# Patient Record
Sex: Male | Born: 1955 | Race: Black or African American | Hispanic: No | Smoking: Never smoker
Health system: Southern US, Community
[De-identification: ages and names within clinical notes are randomized; demographics above are authoritative.]

## PROBLEM LIST (undated history)

## (undated) DIAGNOSIS — D649 Anemia, unspecified: Secondary | ICD-10-CM

## (undated) DIAGNOSIS — M25562 Pain in left knee: Secondary | ICD-10-CM

## (undated) DIAGNOSIS — K709 Alcoholic liver disease, unspecified: Secondary | ICD-10-CM

## (undated) DIAGNOSIS — F1010 Alcohol abuse, uncomplicated: Secondary | ICD-10-CM

## (undated) DIAGNOSIS — R569 Unspecified convulsions: Secondary | ICD-10-CM

## (undated) DIAGNOSIS — E785 Hyperlipidemia, unspecified: Secondary | ICD-10-CM

## (undated) DIAGNOSIS — H25019 Cortical age-related cataract, unspecified eye: Secondary | ICD-10-CM

## (undated) HISTORY — DX: Pain in left knee: M25.562

## (undated) HISTORY — PX: WISDOM TOOTH EXTRACTION: SHX21

## (undated) HISTORY — DX: Alcoholic liver disease, unspecified: K70.9

## (undated) HISTORY — DX: Hyperlipidemia, unspecified: E78.5

## (undated) HISTORY — DX: Anemia, unspecified: D64.9

## (undated) HISTORY — DX: Cortical age-related cataract, unspecified eye: H25.019

## (undated) HISTORY — DX: Alcohol abuse, uncomplicated: F10.10

## (undated) HISTORY — PX: KNEE SURGERY: SHX244

---

## 1978-03-16 HISTORY — PX: FINGER SURGERY: SHX640

## 2004-06-04 ENCOUNTER — Emergency Department (HOSPITAL_COMMUNITY): Admission: EM | Admit: 2004-06-04 | Discharge: 2004-06-04 | Payer: Self-pay | Admitting: Emergency Medicine

## 2004-06-12 ENCOUNTER — Emergency Department (HOSPITAL_COMMUNITY): Admission: EM | Admit: 2004-06-12 | Discharge: 2004-06-12 | Payer: Self-pay | Admitting: *Deleted

## 2011-03-27 ENCOUNTER — Emergency Department (HOSPITAL_COMMUNITY): Payer: Self-pay

## 2011-03-27 ENCOUNTER — Encounter (HOSPITAL_COMMUNITY): Payer: Self-pay | Admitting: *Deleted

## 2011-03-27 ENCOUNTER — Emergency Department (HOSPITAL_COMMUNITY)
Admission: EM | Admit: 2011-03-27 | Discharge: 2011-03-27 | Disposition: A | Discharge status: Home / Self Care | Payer: Self-pay | Attending: Emergency Medicine | Admitting: Emergency Medicine

## 2011-03-27 DIAGNOSIS — F1010 Alcohol abuse, uncomplicated: Secondary | ICD-10-CM | POA: Insufficient documentation

## 2011-03-27 DIAGNOSIS — R569 Unspecified convulsions: Secondary | ICD-10-CM | POA: Insufficient documentation

## 2011-03-27 DIAGNOSIS — J189 Pneumonia, unspecified organism: Secondary | ICD-10-CM | POA: Insufficient documentation

## 2011-03-27 HISTORY — DX: Unspecified convulsions: R56.9

## 2011-03-27 LAB — POCT I-STAT, CHEM 8
BUN: 9 mg/dL (ref 6–23)
Calcium, Ion: 1.06 mmol/L — ABNORMAL LOW (ref 1.12–1.32)
Chloride: 102 meq/L (ref 96–112)
Creatinine, Ser: 1 mg/dL (ref 0.50–1.35)
Glucose, Bld: 72 mg/dL (ref 70–99)
HCT: 44 % (ref 39.0–52.0)
Hemoglobin: 15 g/dL (ref 13.0–17.0)
Potassium: 3.7 meq/L (ref 3.5–5.1)
Sodium: 137 meq/L (ref 135–145)
TCO2: 22 mmol/L (ref 0–100)

## 2011-03-27 LAB — DIFFERENTIAL
Basophils Absolute: 0 10*3/uL (ref 0.0–0.1)
Basophils Relative: 0 % (ref 0–1)
Lymphocytes Relative: 37 % (ref 12–46)
Monocytes Absolute: 0.6 10*3/uL (ref 0.1–1.0)
Neutro Abs: 3 10*3/uL (ref 1.7–7.7)
Neutrophils Relative %: 53 % (ref 43–77)

## 2011-03-27 LAB — RAPID URINE DRUG SCREEN, HOSP PERFORMED
Amphetamines: NOT DETECTED
Barbiturates: NOT DETECTED
Benzodiazepines: NOT DETECTED
Cocaine: POSITIVE — AB
Opiates: NOT DETECTED
Tetrahydrocannabinol: POSITIVE — AB

## 2011-03-27 LAB — URINALYSIS, ROUTINE W REFLEX MICROSCOPIC
Bilirubin Urine: NEGATIVE
Leukocytes, UA: NEGATIVE
Nitrite: NEGATIVE
Specific Gravity, Urine: 1.026 (ref 1.005–1.030)
Urobilinogen, UA: 1 mg/dL (ref 0.0–1.0)

## 2011-03-27 LAB — CBC
HCT: 38.9 % — ABNORMAL LOW (ref 39.0–52.0)
MCHC: 35.5 g/dL (ref 30.0–36.0)
Platelets: 166 10*3/uL (ref 150–400)
RDW: 12.8 % (ref 11.5–15.5)
WBC: 5.7 10*3/uL (ref 4.0–10.5)

## 2011-03-27 LAB — ETHANOL: Alcohol, Ethyl (B): 42 mg/dL — ABNORMAL HIGH (ref 0–11)

## 2011-03-27 MED ORDER — LEVOFLOXACIN 500 MG PO TABS: 500.0000 mg | ORAL_TABLET | Freq: Every day | ORAL | Status: AC

## 2011-03-27 MED ORDER — MOXIFLOXACIN HCL IN NACL 400 MG/250ML IV SOLN: 400.0000 mg | Freq: Once | INTRAVENOUS | Status: DC

## 2011-03-27 MED ORDER — SODIUM CHLORIDE 0.9 % IV SOLN
1000.0000 mg | Freq: Once | INTRAVENOUS | Status: AC
  Administered 2011-03-27: 1000 mg via INTRAVENOUS
  Filled 2011-03-27: qty 10

## 2011-03-27 MED ORDER — MOXIFLOXACIN HCL 400 MG PO TABS
400.0000 mg | ORAL_TABLET | Freq: Once | ORAL | Status: AC
  Administered 2011-03-27: 400 mg via ORAL
  Filled 2011-03-27: qty 1

## 2011-03-27 NOTE — ED Provider Notes (Signed)
History     CSN: [id number]  Arrival date & time 03/27/11  4098   First MD Initiated Contact with Patient 03/27/11 0119      Chief Complaint  Patient presents with  . Seizures    (Consider location/radiation/quality/duration/timing/severity/associated sxs/prior treatment) Patient is a 56 y.o. male presenting with seizures. The history is provided by the patient. No language interpreter was used.  Seizures  This is a recurrent problem. The current episode started 12 to 24 hours ago. The problem has not changed since onset.Number of times: unknown. Duration: unknown no one at jail witnessed. Pertinent negatives include no sleepiness, no headaches, no speech difficulty, no visual disturbance, no neck stiffness, no sore throat, no chest pain, no cough, no nausea, no vomiting, no diarrhea and no muscle weakness. unable to say The episode was not witnessed. There was no sensation of an aura present. The seizures did not continue in the ED. Possible causes include missed seizure meds and change in alcohol use. The maximum temperature recorded prior to his arrival was 100 to 100.9 F. There were no medications administered prior to arrival.    Past Medical History  Diagnosis Date  . Seizures     History reviewed. No pertinent past surgical history.  History reviewed. No pertinent family history.  History  Substance Use Topics  . Smoking status: Never Smoker   . Smokeless tobacco: Not on file  . Alcohol Use: Yes      Review of Systems  Constitutional: Negative for fever and activity change.  HENT: Negative for sore throat and facial swelling.   Eyes: Negative for visual disturbance.  Respiratory: Negative for cough.   Cardiovascular: Negative for chest pain.  Gastrointestinal: Negative for nausea, vomiting, diarrhea and abdominal distention.  Genitourinary: Negative for difficulty urinating.  Skin: Negative.   Neurological: Positive for seizures. Negative for speech difficulty  and headaches.  Hematological: Negative.   Psychiatric/Behavioral: Negative.     Allergies  Review of patient's allergies indicates no known allergies.  Home Medications   Current Outpatient Rx  Name Route Sig Dispense Refill  . PRESCRIPTION MEDICATION  Seizure medication. Patient says it starts with a "T" but he can't tell us any more information about it.      BP 145/99  Pulse 69  Temp(Src) 98.1 F (36.7 C) (Oral)  Resp 23  SpO2 100%  Physical Exam  Constitutional: He appears well-developed and well-nourished. No distress.  HENT:  Head: Normocephalic and atraumatic.  Mouth/Throat: Oropharynx is clear and moist. No oropharyngeal exudate.  Eyes: Conjunctivae and EOM are normal. Pupils are equal, round, and reactive to light.       No hemotympanum  Neck: Normal range of motion. Neck supple. No JVD present.  Cardiovascular: Normal rate and regular rhythm.   Pulmonary/Chest: Effort normal and breath sounds normal. He has no wheezes. He has no rales.  Abdominal: Soft. Bowel sounds are normal. There is no tenderness. There is no rebound and no guarding.  Musculoskeletal: Normal range of motion. He exhibits no edema.  Neurological: He is alert. He has normal reflexes. No cranial nerve deficit.  Skin: Skin is warm and dry.  Psychiatric: He has a normal mood and affect.    ED Course  Procedures (including critical care time)  Labs Reviewed  CBC - Abnormal; Notable for the following:    RBC 4.15 (*)    HCT 38.9 (*)    All other components within normal limits  ETHANOL - Abnormal; Notable for the following:  Alcohol, Ethyl (B) 42 (*)    All other components within normal limits  POCT I-STAT, CHEM 8 - Abnormal; Notable for the following:    Calcium, Ion 1.06 (*)    All other components within normal limits  DIFFERENTIAL  I-STAT, CHEM 8  URINALYSIS, ROUTINE W REFLEX MICROSCOPIC  URINE RAPID DRUG SCREEN (HOSP PERFORMED)   No results found.   No diagnosis  found.    MDM  Return for worsening symptoms take all antibiotics restart seizure medications.  DFollow up with your own doctor and neurologist        Tomika Eckles K Torrez Renfroe-Rasch, MD 03/27/11 [phone]

## 2011-03-27 NOTE — ED Notes (Signed)
No changes, (denies: needs, questions, concerns or sx unmet), denies pain at this time, given Rx x2, IV cath d/c'd cath intact, site U. Calm, alert, NAD, out in w/c with deputy & PD.
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
## 2011-03-27 NOTE — ED Notes (Signed)
Pt c/o seizures, multiple seizures in the past couple days.  Last dose of seizure medication on Wednesday.  States med starts with T, unable to say what medication.

## 2011-03-27 NOTE — ED Notes (Signed)
Back from CT/xray, no changes, alert, NAD, calm, interactive, Keppra infused, law enforcement at [facility], no complaints.

## 2013-03-13 IMAGING — CT CT HEAD W/O CM
2 series · 16 of 30 positions shown, 20 images · non-contrast
Comparison: None.

CLINICAL DATA: Seizures.

CT HEAD WITHOUT CONTRAST
TECHNIQUE: Contiguous axial images were obtained from the base of
the skull through the vertex without contrast.

[Series 2: head w/o · axial · non-contrast · 0.49mm/px · z∈[+110,+240]mm · 13 of 32 slices shown, 17 images]
[im 3/32  brain]
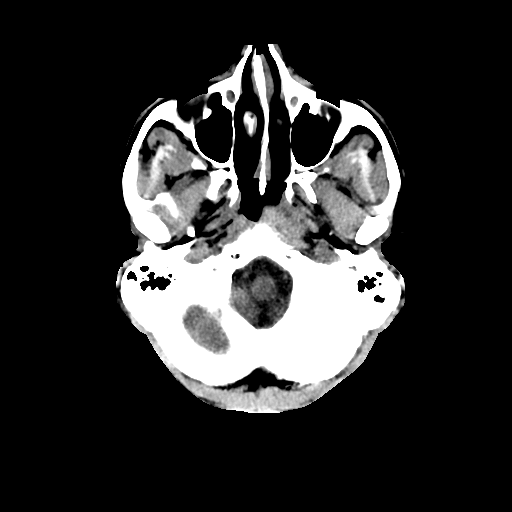
[im 3/32  bone]
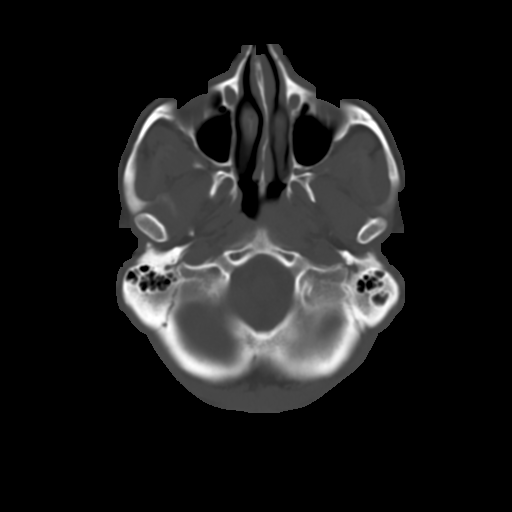
[im 5/32  brain]
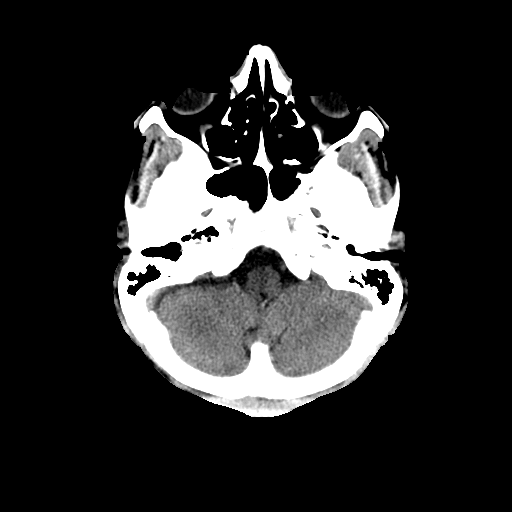
[im 7/32  brain]
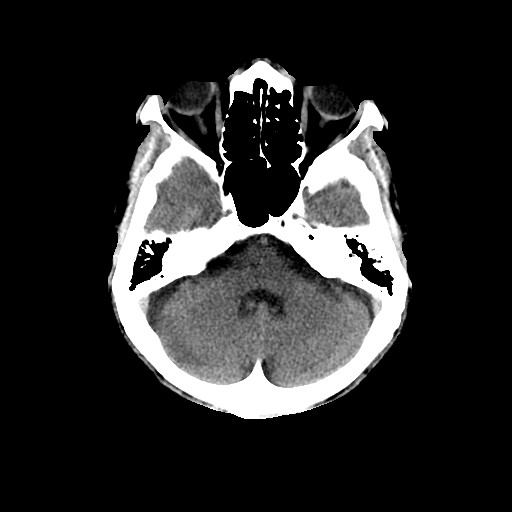
[im 9/32  brain]
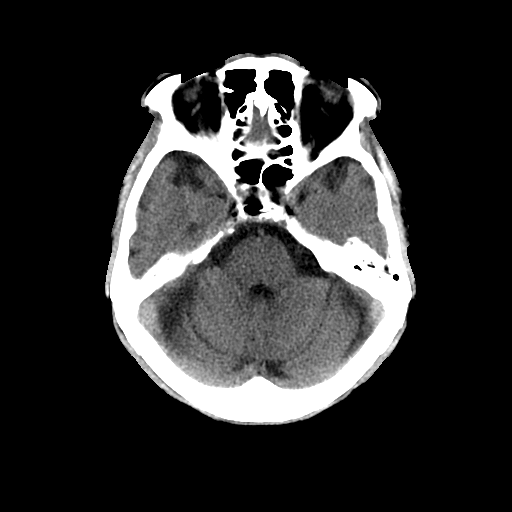
[im 12/32  brain]
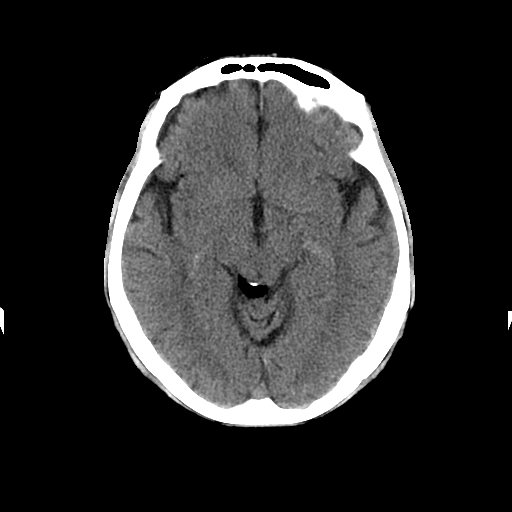
[im 12/32  bone]
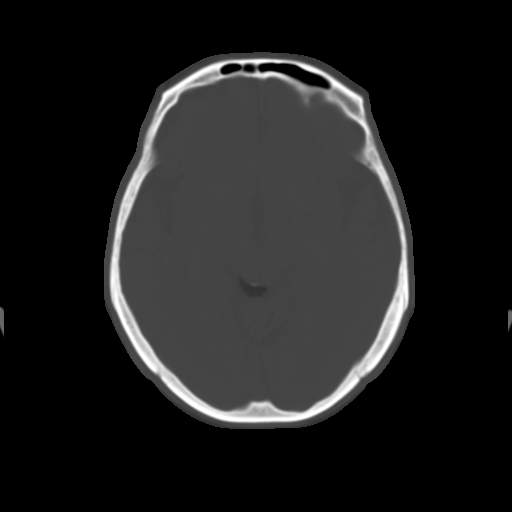
[im 14/32  brain]
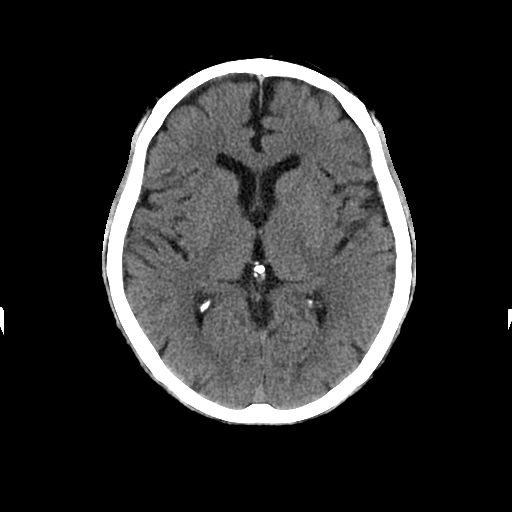
[im 16/32  brain]
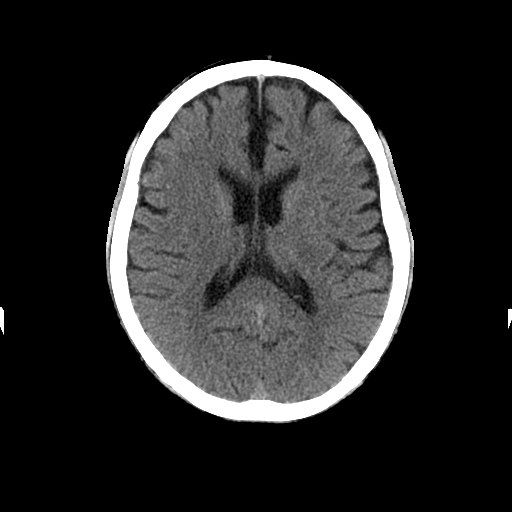
[im 18/32  brain]
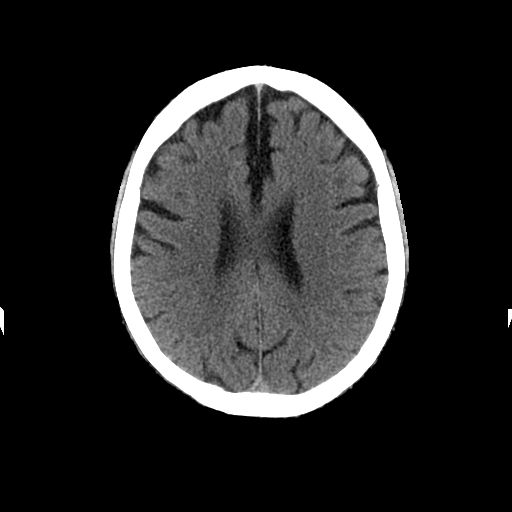
[im 20/32  brain]
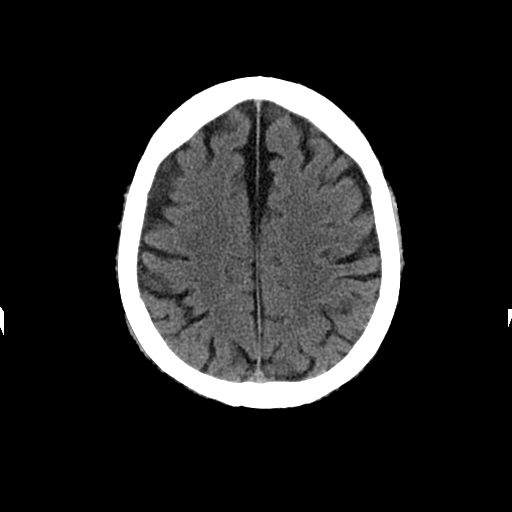
[im 20/32  bone]
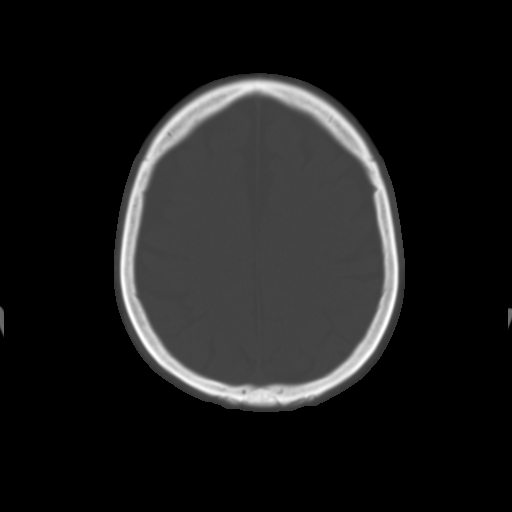
[im 23/32  brain]
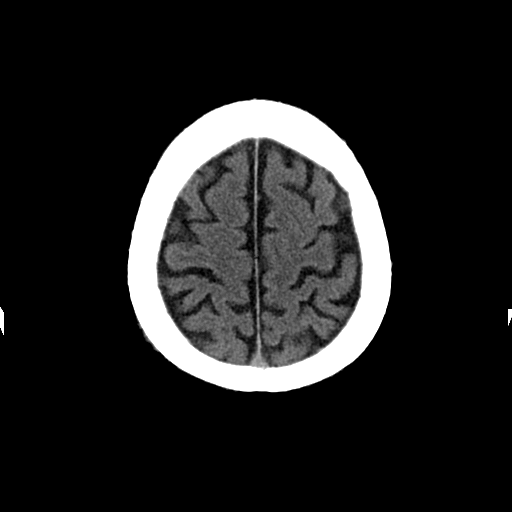
[im 25/32  brain]
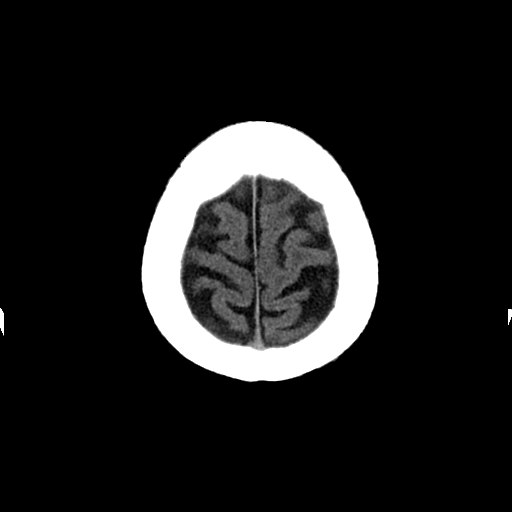
[im 27/32  brain]
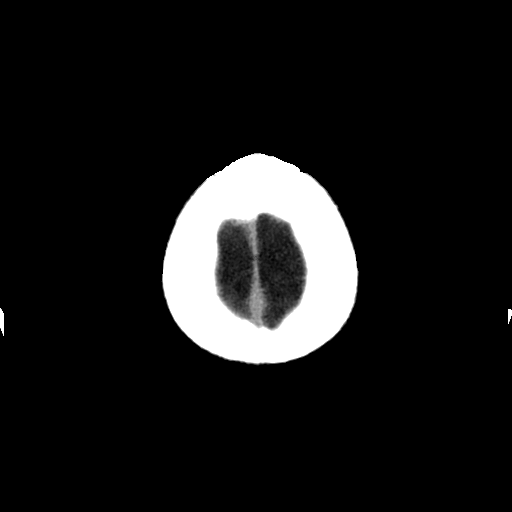
[im 29/32  brain]
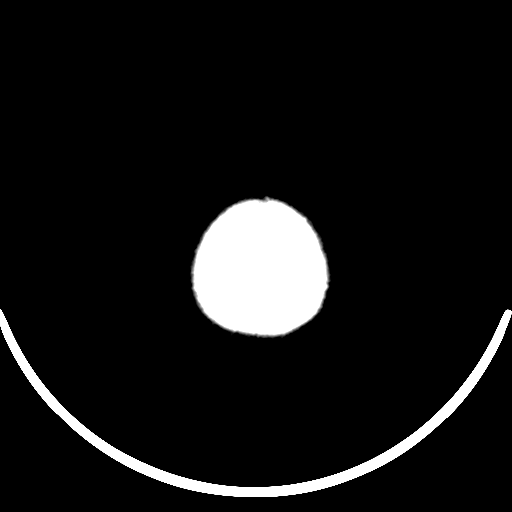
[im 29/32  bone]
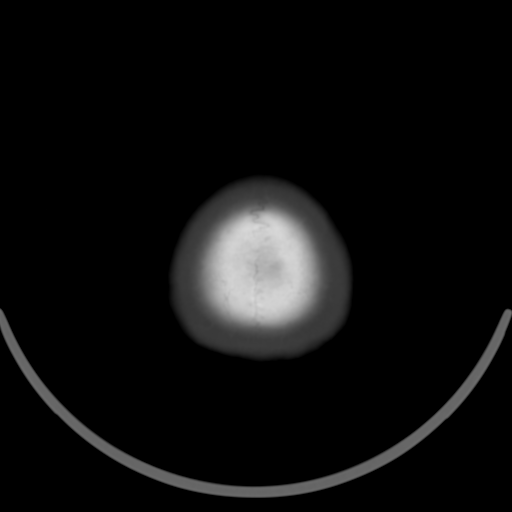

[Series 3: head w/o bone · axial · non-contrast · 0.49mm/px · z∈[+110,+155]mm · 3 of 32 slices shown]
[im 3/32  bone]
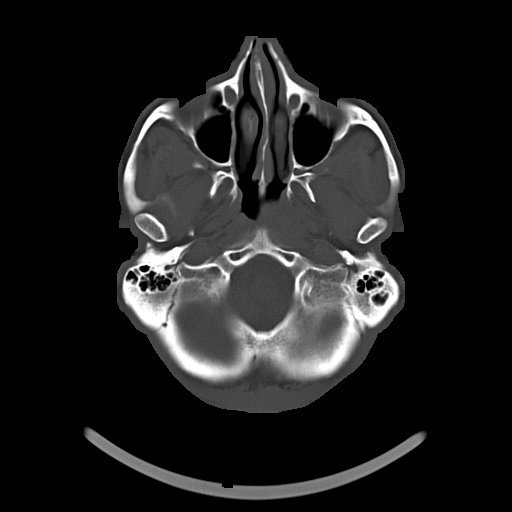
[im 7/32  bone]
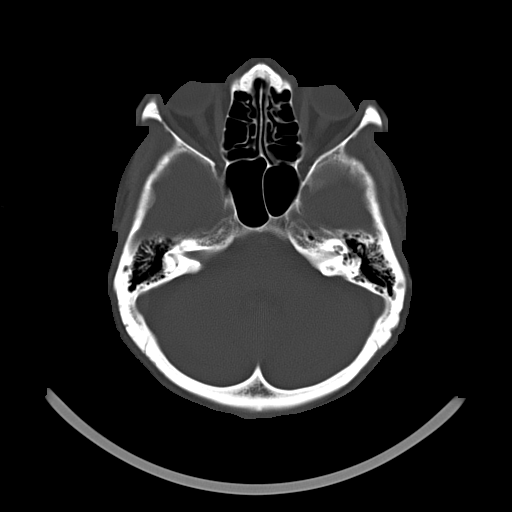
[im 12/32  bone]
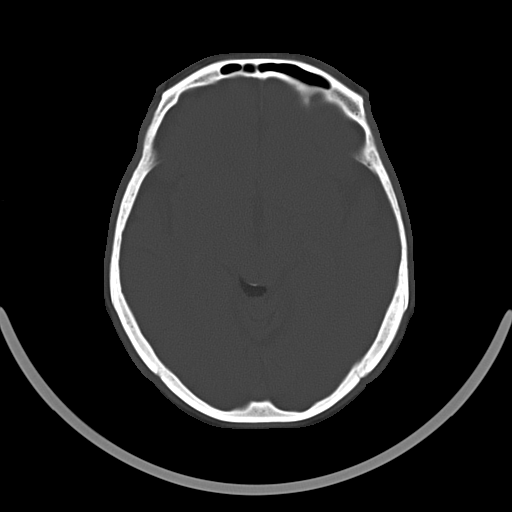

[16 of 30 positions shown; findings below may reference images not displayed]

FINDINGS: No mass lesion, mass effect, midline shift,
hydrocephalus, hemorrhage.  No territorial ischemia or acute
infarction.  Benign basal ganglia calcifications.
IMPRESSION: Negative CT head.

## 2013-03-13 IMAGING — CR DG CHEST 2V
2 series · 2 of 2 positions shown · non-contrast
Comparison: None.

CLINICAL DATA: Seizure.  Chest pain.

CHEST - 2 VIEW

[w chest pa]
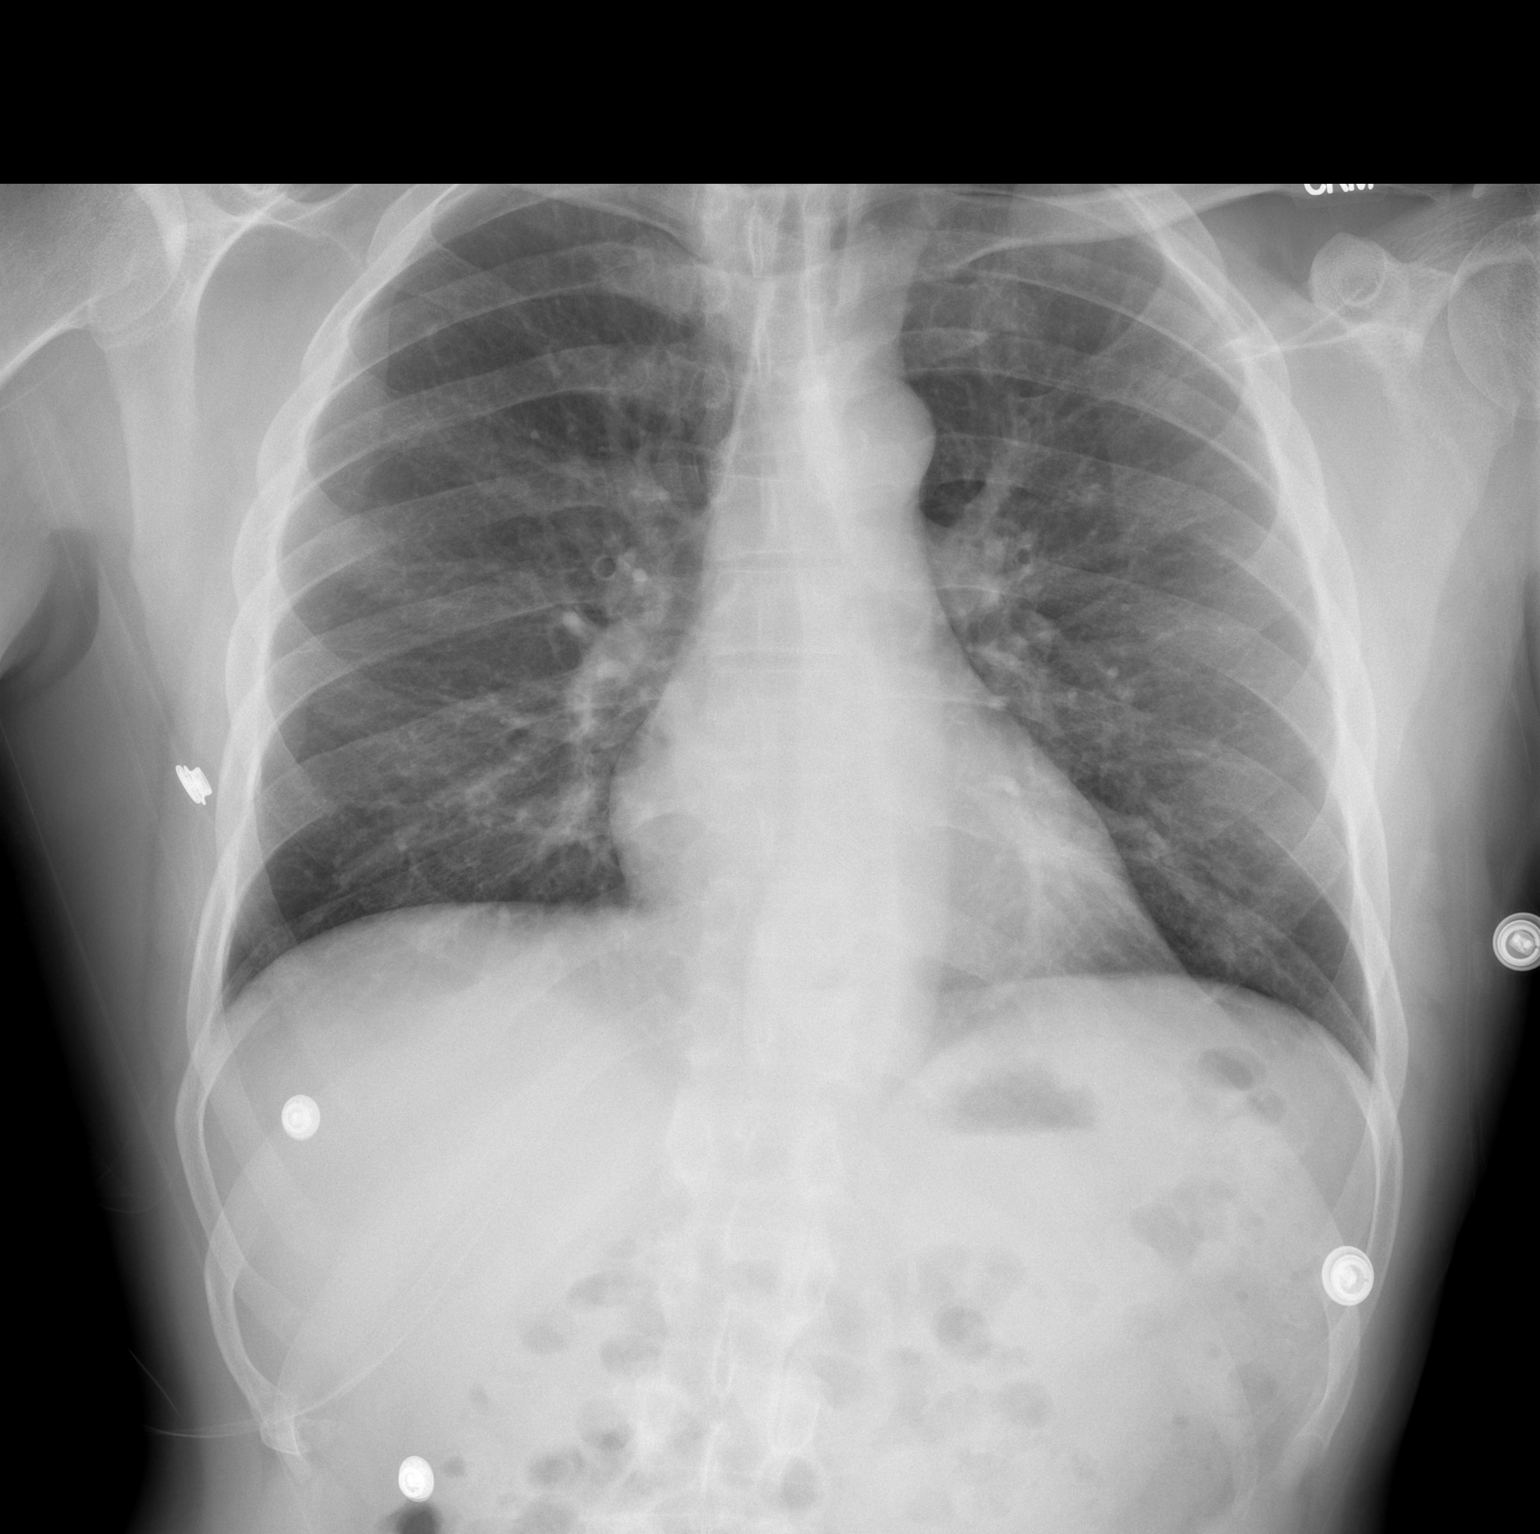

[w chest lat]
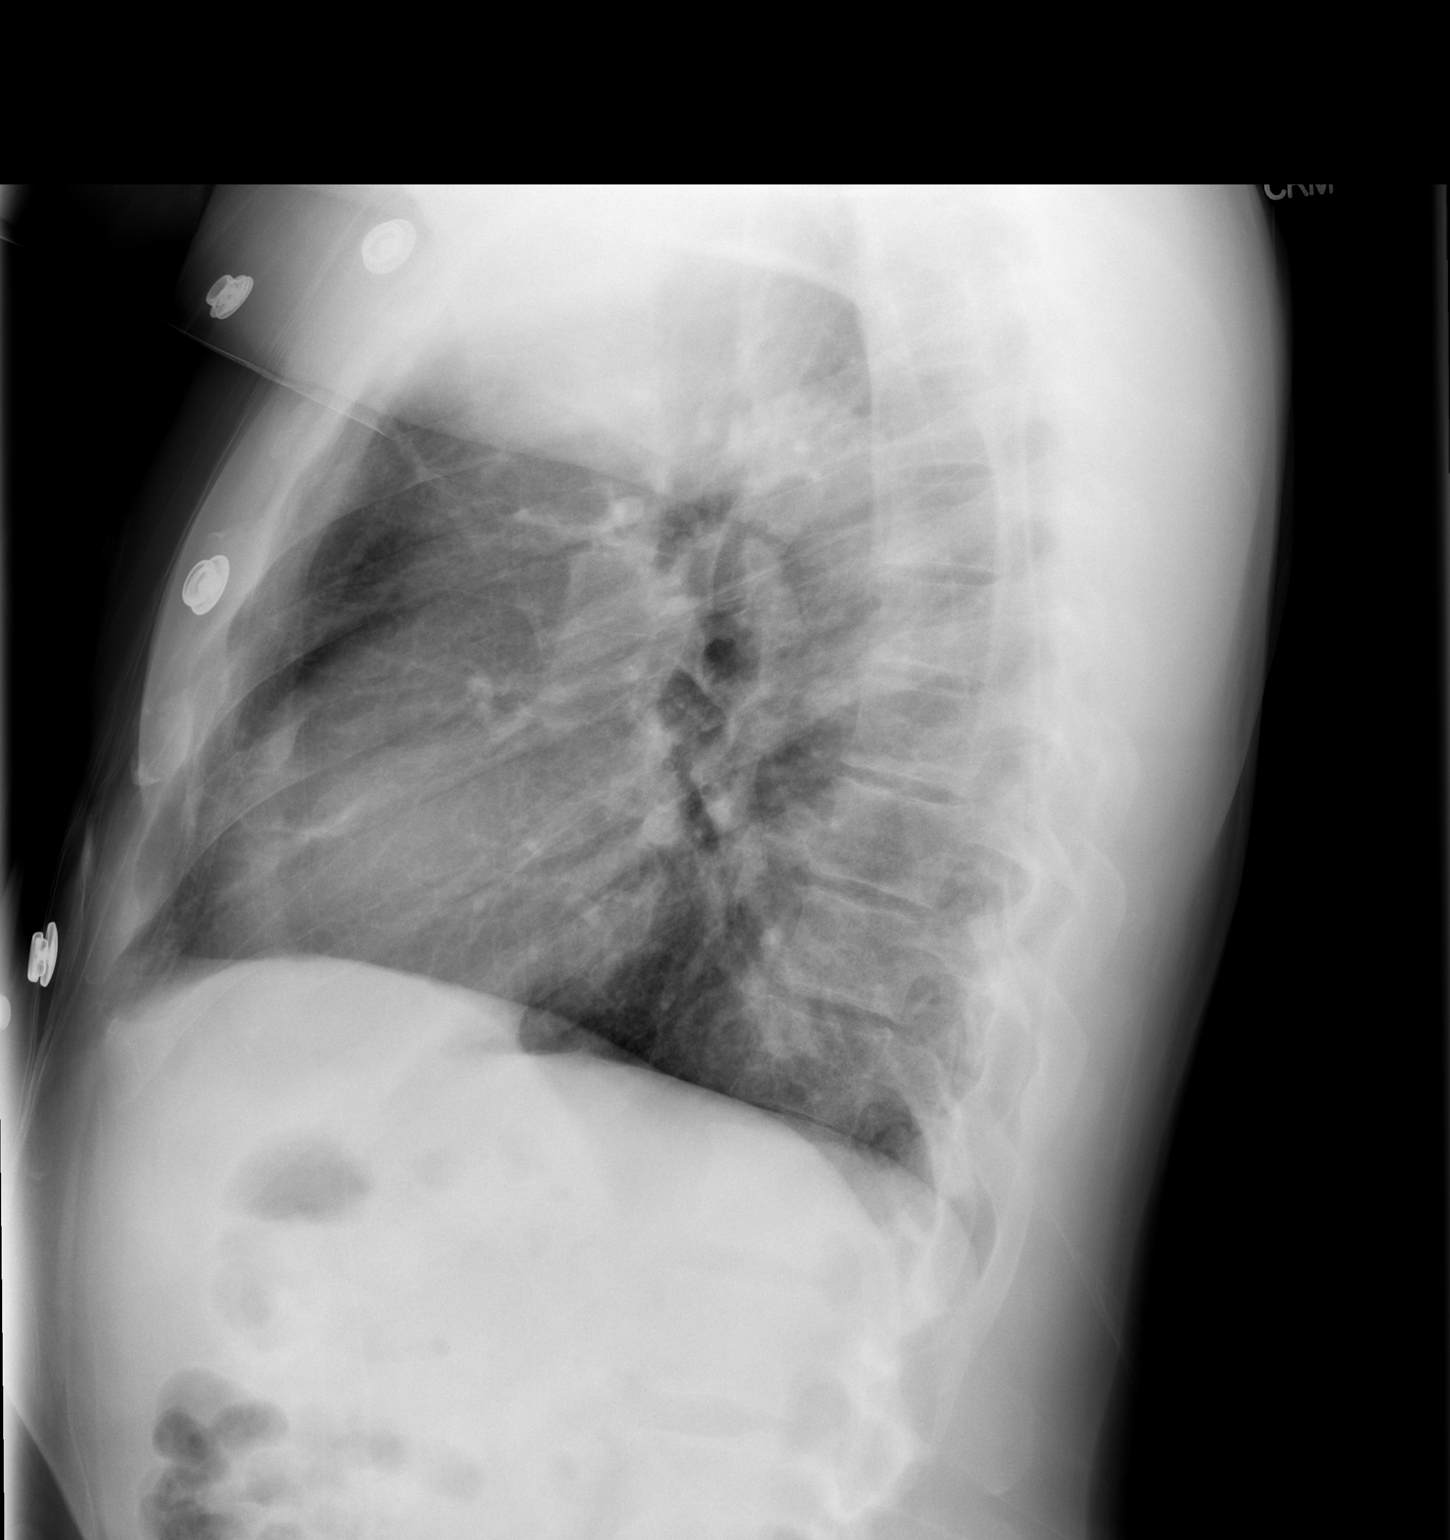

[2 of 2 positions shown; findings below may reference images not displayed]

FINDINGS: Patchy airspace opacity is present in the retrocardiac
region and over the lower thoracic spine on the lateral view.  In
the setting of seizure, this could represent aspiration
pneumonitis.  Infection is another possibility.  Cardiopericardial
silhouette appears within normal limits.  Trachea midline.  No
effusion.
IMPRESSION: Patchy left lower lobe airspace opacity which may represent
infection or aspiration pneumonitis.

## 2018-11-22 ENCOUNTER — Ambulatory Visit (HOSPITAL_COMMUNITY)
Admission: EM | Admit: 2018-11-22 | Discharge: 2018-11-22 | Disposition: A | Discharge status: Home / Self Care | Payer: Self-pay | Attending: Psychiatry | Admitting: Psychiatry

## 2018-11-22 DIAGNOSIS — F4324 Adjustment disorder with disturbance of conduct: Secondary | ICD-10-CM | POA: Insufficient documentation

## 2018-11-22 DIAGNOSIS — F1010 Alcohol abuse, uncomplicated: Secondary | ICD-10-CM | POA: Insufficient documentation
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
## 2019-07-19 ENCOUNTER — Encounter: Payer: Self-pay | Admitting: Gastroenterology

## 2019-08-01 ENCOUNTER — Ambulatory Visit: Payer: Self-pay | Admitting: Gastroenterology

## 2019-08-01 ENCOUNTER — Encounter: Payer: Self-pay | Admitting: *Deleted

## 2019-08-02 ENCOUNTER — Telehealth: Payer: Self-pay

## 2019-08-02 NOTE — Telephone Encounter (Signed)
Pt referred from the [place] for anemia.  He has an appt on Friday, 5-21 with Dr. Havery Moros. We have referral paperwork which includes labs from 04-2019.  Called and spoke to guardian, Roselle Locus from [place] and she indicated pt had additional lab work on 07-27-19.  I have faxed a request to the [place] for lab work to be sent to [place] asap.

## 2019-08-02 NOTE — Telephone Encounter (Signed)
Received additional lab work for Friday's appt.

## 2019-08-04 ENCOUNTER — Ambulatory Visit: Payer: Self-pay | Admitting: Gastroenterology

## 2019-08-08 ENCOUNTER — Encounter: Payer: Self-pay | Admitting: Gastroenterology

## 2019-08-08 ENCOUNTER — Other Ambulatory Visit (INDEPENDENT_AMBULATORY_CARE_PROVIDER_SITE_OTHER): Payer: No Typology Code available for payment source

## 2019-08-08 ENCOUNTER — Ambulatory Visit (INDEPENDENT_AMBULATORY_CARE_PROVIDER_SITE_OTHER): Payer: No Typology Code available for payment source | Admitting: Gastroenterology

## 2019-08-08 VITALS — BP 124/68 | HR 70 | Ht 71.0 in | Wt 172.0 lb

## 2019-08-08 DIAGNOSIS — R748 Abnormal levels of other serum enzymes: Secondary | ICD-10-CM | POA: Diagnosis not present

## 2019-08-08 DIAGNOSIS — D509 Iron deficiency anemia, unspecified: Secondary | ICD-10-CM | POA: Diagnosis not present

## 2019-08-08 LAB — HEPATIC FUNCTION PANEL
ALT: 260 U/L — ABNORMAL HIGH (ref 0–53)
AST: 255 U/L — ABNORMAL HIGH (ref 0–37)
Albumin: 4.2 g/dL (ref 3.5–5.2)
Alkaline Phosphatase: 123 U/L — ABNORMAL HIGH (ref 39–117)
Bilirubin, Direct: 0.3 mg/dL (ref 0.0–0.3)
Total Bilirubin: 0.6 mg/dL (ref 0.2–1.2)
Total Protein: 8.3 g/dL (ref 6.0–8.3)

## 2019-08-08 MED ORDER — FERROUS SULFATE 325 (65 FE) MG PO TABS: ORAL_TABLET | ORAL | 3 refills | Status: AC

## 2019-08-08 NOTE — Patient Instructions (Addendum)
If you are age 65 or older, your body mass index should be between 23-30. Your Body mass index is 23.99 kg/m. If this is out of the aforementioned range listed, please consider follow up with your Primary Care Provider.  If you are age 10 or younger, your body mass index should be between 19-25. Your Body mass index is 23.99 kg/m. If this is out of the aformentioned range listed, please consider follow up with your Primary Care Provider.    Please go to the lab in the basement of our building to have lab work done as you leave today. Hit "B" for basement when you get on the elevator.  When the doors open the lab is on your left.  We will call you with the results. Thank you.  Due to recent changes in healthcare laws, you may see the results of your imaging and laboratory studies on MyChart before your provider has had a chance to review them.  We understand that in some cases there may be results that are confusing or concerning to you. Not all laboratory results come back in the same time frame and the provider may be waiting for multiple results in order to interpret others.  Please give [place] 48 hours in order for your provider to thoroughly review all the results before contacting the office for clarification of your results.   We have sent the following medications to your pharmacy for you to pick up at your convenience: Ferrous sulfate 325 mg: Take once to twice a day as tolerated  Thank you for entrusting me with your care and for choosing [facility], Dr. Houghton Cellar

## 2019-08-08 NOTE — Progress Notes (Signed)
HPI :  64 year old male with a history of reported remote alcohol use, anemia, hyperlipidemia, reported prior history of seizures, referred by the [facility] for anemia and abnormal liver enzymes.  He is a new patient to [place] and accompanied by his guardian Linna Darner today.  The patient has a reported history of homelessness.  He had lab work done per the [place] as outlined below and referred to [place] regarding this result  Labs: 05/09/19 -  Alt 23, ast 20, T BIL 0.6 hGB 12.0, PLT 266, wbc 6.68  07/27/2019: hGB 10.4, mcv 88.3, PLT 129, wbc 8.4 HEP c ab (-) Ap 169, ast 149, alt 147 Ferritin 134 Iron 25, TIBC 405, iron sat 6.2%,   The anemia was first noted in February with a hemoglobin of 12.0 which has progressed to 10.4 as of May 13.  He was noted to have iron deficiency.  He denies any blood in his stools.  Denies any problems with his bowels.  Does not donate blood.  Denies any abdominal pains.  No reflux symptoms.  No abdominal pain.  No dysphagia.  His weight is stable.  He is never had a prior colonoscopy.  No family history of colon cancer or GI tract malignancy.  He does not take NSAIDs routinely.  He does not really take any medications other than vitamin D.  He otherwise in regards to his liver enzymes had normal liver enzymes in February and then mild elevation in May as outlined above.  He denies any history of liver disease as noted.  Reportedly drank alcohol in the past however he denies drinking any alcohol now.  Denies a history of cirrhosis.  Denies any myalgias.  No history of jaundice.  Denies any supplements or herbals other than vitamin D   Past Medical History:  Diagnosis Date  . Alcohol abuse   . 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 Anemia   . Cataract cortical, senile   . Hyperlipidemia   . Left knee pain   . Seizures ([place])      History reviewed. No pertinent surgical history. Family History  Problem Relation Age of Onset  . Heart disease Father   . Heart disease Sister     . Heart disease Brother    Social History   Tobacco Use  . Smoking status: Never Smoker  . Smokeless tobacco: Never Used  Substance Use Topics  . Alcohol use: Not Currently  . Drug use: Not Currently   No current outpatient medications on file.   No current facility-administered medications for this visit.   No Known Allergies   Review of Systems: All systems reviewed and negative except where noted in HPI.    Labs per in HPI  Physical Exam: BP 124/68   Pulse 70   Ht 5\' 11"  (1.803 m)   Wt 172 lb (78 kg)   BMI 23.99 kg/m  Constitutional: Pleasant, male in no acute distress. HEENT: Normocephalic and atraumatic. Conjunctivae are normal. No scleral icterus. Neck supple.  Cardiovascular: Normal rate, regular rhythm.  Pulmonary/chest: Effort normal and breath sounds normal.  Abdominal: Soft, nondistended, nontender. There are no masses palpable.  Extremities: no edema Lymphadenopathy: No cervical adenopathy noted. Neurological: Alert and oriented to person place and time. Skin: Skin is warm and dry. No rashes noted. Psychiatric: Normal mood and affect. Behavior is normal.   ASSESSMENT AND PLAN: 64 year old male here for new patient assessment of the following:  Iron deficiency anemia - this appears new in recent  months, the patient is asymptomatic.  I discussed differential diagnosis with him and his guardian at length.  To evaluate this issue I am recommending an EGD and colonoscopy as he has never had either of these tests.  I discussed what they are, risks and benefits of the exams and anesthesia, and reasoning behind why this is recommended, most importantly rule out malignancy.  The patient's guardian Carmel Sacramento strongly wishes the patient to proceed with this, however he states he will not have colonoscopy and is not sure about the upper endoscopy.  The patient verbalizes understanding of why I am recommending this however had a hard time articulating that.  We spent  several minutes discussing the issue and again he states he will never have a colonoscopy, he will not do the bowel prep. Cheri would like some time to discuss this with him at home and see if she can convince him to do it.  Otherwise recommend he start iron supplementation which she has not started yet, ferrous sulfate 325mg  twice daily if he can tolerate it at that dose.  Would repeat CBC in 1 month for reassessment.  Again this is a strong recommendation to have these done to rule out underlying malignancy and the patient is declining, they will contact me if he changes his mind for direct scheduling  Abnormal liver enzymes - one-time elevation in liver enzymes as outlined above.  He is otherwise asymptomatic.  I will repeat the labs today.  If these remain persistently elevated he will need additional serologic work-up as well as a right upper quadrant ultrasound.  He is agreeable to this evaluation if needed.  We will phone results to [phone] Linna Darner - the patient's guardian. All questions answered.  Suncook Cellar, MD [facility]

## 2019-08-09 ENCOUNTER — Other Ambulatory Visit: Payer: Self-pay

## 2019-08-09 ENCOUNTER — Telehealth: Payer: Self-pay | Admitting: Gastroenterology

## 2019-08-09 DIAGNOSIS — R748 Abnormal levels of other serum enzymes: Secondary | ICD-10-CM

## 2019-08-09 NOTE — Telephone Encounter (Signed)
See lab results for additional details.  

## 2019-08-17 ENCOUNTER — Ambulatory Visit (HOSPITAL_COMMUNITY)
Admission: RE | Admit: 2019-08-17 | Discharge: 2019-08-17 | Disposition: A | Discharge status: Home / Self Care | Payer: No Typology Code available for payment source | Source: Ambulatory Visit | Attending: Gastroenterology | Admitting: Gastroenterology

## 2019-08-17 ENCOUNTER — Other Ambulatory Visit: Payer: Self-pay

## 2019-08-17 ENCOUNTER — Other Ambulatory Visit (INDEPENDENT_AMBULATORY_CARE_PROVIDER_SITE_OTHER): Payer: No Typology Code available for payment source

## 2019-08-17 DIAGNOSIS — R748 Abnormal levels of other serum enzymes: Secondary | ICD-10-CM

## 2019-08-17 LAB — PROTIME-INR
INR: 1 ratio (ref 0.8–1.0)
Prothrombin Time: 11.4 s (ref 9.6–13.1)

## 2019-08-21 ENCOUNTER — Other Ambulatory Visit: Payer: Self-pay

## 2019-08-21 DIAGNOSIS — R932 Abnormal findings on diagnostic imaging of liver and biliary tract: Secondary | ICD-10-CM

## 2019-08-21 DIAGNOSIS — R7989 Other specified abnormal findings of blood chemistry: Secondary | ICD-10-CM

## 2019-08-21 LAB — HEPATITIS B SURFACE ANTIGEN: Hepatitis B Surface Ag: NONREACTIVE

## 2019-08-21 LAB — ANTI-SMOOTH MUSCLE ANTIBODY, IGG: Actin (Smooth Muscle) Antibody (IGG): <20 U (ref <20)

## 2019-08-21 LAB — ANTI-NUCLEAR AB-TITER (ANA TITER): ANA Titer 1: 1:40 {titer} — ABNORMAL HIGH

## 2019-08-21 LAB — ANA: Anti Nuclear Antibody (ANA): POSITIVE — AB

## 2019-08-21 LAB — HEPATITIS B CORE ANTIBODY, TOTAL: Hep B Core Total Ab: NONREACTIVE

## 2019-08-21 LAB — HEPATITIS B SURFACE ANTIBODY, QUANTITATIVE: Hepatitis B-Post: <5 m[IU]/mL — ABNORMAL LOW (ref >=10)

## 2019-08-21 LAB — ALPHA-1-ANTITRYPSIN: A-1 Antitrypsin, Ser: 198 mg/dL (ref 83–199)

## 2019-08-30 ENCOUNTER — Other Ambulatory Visit: Payer: Self-pay

## 2019-08-30 ENCOUNTER — Encounter: Payer: Self-pay | Admitting: Gastroenterology

## 2019-08-30 ENCOUNTER — Telehealth: Payer: Self-pay | Admitting: Gastroenterology

## 2019-08-30 NOTE — Telephone Encounter (Signed)
Kevin Torres is patients Education officer, museum she is calling to discuss patients diagnosis and has questions that are needed for [place] records please advise

## 2019-08-30 NOTE — Telephone Encounter (Signed)
Reviewed chart, specifically [facility] is asking if his conditions of iron deficiency anemia and fatty liver are related to agent orange or other diseases caused by agent orange, and they are not. I don't think any relationship there. Thanks

## 2019-08-30 NOTE — Telephone Encounter (Signed)
Social worker Barbera Setters is faxing a list of herbicides - the [facility] is [facility] if Dr. Havery Moros can review and advise if they could be a potential cause of his GI medical conditions.    Jan, FYI if you see a fax could you place on Dr. Doyne Keel desk.

## 2019-08-31 NOTE — Telephone Encounter (Signed)
Called Jasper Loser at [phone], Education officer, museum for pt and relayed Dr. Doyne Keel comments. She expressed understanding and appreciated the call.

## 2019-09-25 ENCOUNTER — Other Ambulatory Visit (INDEPENDENT_AMBULATORY_CARE_PROVIDER_SITE_OTHER): Payer: No Typology Code available for payment source

## 2019-09-25 DIAGNOSIS — R7989 Other specified abnormal findings of blood chemistry: Secondary | ICD-10-CM

## 2019-09-25 DIAGNOSIS — R932 Abnormal findings on diagnostic imaging of liver and biliary tract: Secondary | ICD-10-CM

## 2019-09-25 LAB — HEPATIC FUNCTION PANEL
ALT: 27 U/L (ref 0–53)
AST: 30 U/L (ref 0–37)
Albumin: 4.1 g/dL (ref 3.5–5.2)
Alkaline Phosphatase: 110 U/L (ref 39–117)
Bilirubin, Direct: 0.1 mg/dL (ref 0.0–0.3)
Total Bilirubin: 0.5 mg/dL (ref 0.2–1.2)
Total Protein: 8 g/dL (ref 6.0–8.3)

## 2019-09-25 LAB — CBC
HCT: 31.2 % — ABNORMAL LOW (ref 39.0–52.0)
Hemoglobin: 10.4 g/dL — ABNORMAL LOW (ref 13.0–17.0)
MCHC: 33.4 g/dL (ref 30.0–36.0)
MCV: 92.3 fl (ref 78.0–100.0)
Platelets: 242 10*3/uL (ref 150.0–400.0)
RBC: 3.38 Mil/uL — ABNORMAL LOW (ref 4.22–5.81)
RDW: 19.8 % — ABNORMAL HIGH (ref 11.5–15.5)
WBC: 7.4 10*3/uL (ref 4.0–10.5)

## 2019-09-25 LAB — VITAMIN B12: Vitamin B-12: 283 pg/mL (ref 211–911)

## 2019-09-25 LAB — CK: Total CK: 105 U/L (ref 7–232)

## 2019-09-25 LAB — FERRITIN: Ferritin: 38.6 ng/mL (ref 22.0–322.0)

## 2019-09-26 LAB — HEPATITIS A ANTIBODY, TOTAL: Hepatitis A AB,Total: REACTIVE — AB

## 2019-09-27 ENCOUNTER — Other Ambulatory Visit: Payer: Self-pay

## 2019-09-27 NOTE — Progress Notes (Signed)
Order for Hepl[facility]av entered.

## 2019-10-09 ENCOUNTER — Telehealth: Payer: Self-pay

## 2019-10-09 ENCOUNTER — Ambulatory Visit (INDEPENDENT_AMBULATORY_CARE_PROVIDER_SITE_OTHER): Payer: No Typology Code available for payment source | Admitting: Gastroenterology

## 2019-10-09 DIAGNOSIS — Z23 Encounter for immunization: Secondary | ICD-10-CM

## 2019-10-09 NOTE — Telephone Encounter (Signed)
Patient reports his Ultrasound was rescheduled for August.

## 2019-10-09 NOTE — Telephone Encounter (Signed)
-----   Message from Roetta Sessions, [place] sent at 10/05/2019  2:43 PM EDT ----- Regarding: FW: Ultrasound   ----- Message ----- From: Roetta Sessions, CMA Sent: 10/05/2019 To: Roetta Sessions, CMA Subject: Ultrasound                                     Pt had abdominal U/S on 7-20 Tuesday at the [facility].  Have we rec'd?

## 2019-10-09 NOTE — Telephone Encounter (Signed)
We have not received Ultrasound from [place].  Faxed a request to have report sent to Dr. Havery Moros.

## 2019-10-24 ENCOUNTER — Ambulatory Visit (AMBULATORY_SURGERY_CENTER): Payer: Self-pay

## 2019-10-24 ENCOUNTER — Other Ambulatory Visit: Payer: Self-pay

## 2019-10-24 VITALS — Ht 71.0 in | Wt 163.0 lb

## 2019-10-24 DIAGNOSIS — R748 Abnormal levels of other serum enzymes: Secondary | ICD-10-CM

## 2019-10-24 DIAGNOSIS — D509 Iron deficiency anemia, unspecified: Secondary | ICD-10-CM

## 2019-10-24 DIAGNOSIS — Z1211 Encounter for screening for malignant neoplasm of colon: Secondary | ICD-10-CM

## 2019-10-24 MED ORDER — PEG-KCL-NACL-NASULF-NA ASC-C 100 G PO SOLR: 1.0000 | Freq: Once | ORAL | 0 refills | Status: AC

## 2019-10-24 NOTE — Progress Notes (Signed)
[employer] guardian Carmel Sacramento [place]) present at pre visit;  No egg or soy allergy known to patient  No issues with past sedation with any surgeries or procedures No intubation problems in the past  No FH of Malignant Hyperthermia No diet pills per patient No home 02 use per patient  No blood thinners per patient  Pt denies issues with constipation  No A fib or A flutter  EMMI video via [place] 19 guidelines implemented in PV today with Pt and RN   COVID vaccines completed on 09/2019 per guardian; Moviprep RX faxed to [place] per protocol; Prep instructions also printed for legal guardian;   Due to the COVID-19 pandemic we are asking patients to follow these guidelines. Please only bring one care partner. Please be aware that your care partner may wait in the car in the parking lot or if they feel like they will be too hot to wait in the car, they may wait in the lobby on the 4th floor. All care partners are required to wear a mask the entire time (we do not have any that we can provide them), they need to practice social distancing, and we will do a Covid check for all patient's and care partners when you arrive. Also we will check their temperature and your temperature. If the care partner waits in their car they need to stay in the parking lot the entire time and we will call them on their cell phone when the patient is ready for discharge so they can bring the car to the front of the building. Also all patient's will need to wear a mask into building.

## 2019-11-10 ENCOUNTER — Other Ambulatory Visit: Payer: Self-pay

## 2019-11-10 ENCOUNTER — Ambulatory Visit (AMBULATORY_SURGERY_CENTER): Payer: No Typology Code available for payment source | Admitting: Gastroenterology

## 2019-11-10 ENCOUNTER — Encounter: Payer: Self-pay | Admitting: Gastroenterology

## 2019-11-10 VITALS — BP 96/61 | HR 63 | Temp 98.7°F | Resp 14 | Ht 71.0 in | Wt 163.0 lb

## 2019-11-10 DIAGNOSIS — D123 Benign neoplasm of transverse colon: Secondary | ICD-10-CM | POA: Diagnosis not present

## 2019-11-10 DIAGNOSIS — D509 Iron deficiency anemia, unspecified: Secondary | ICD-10-CM | POA: Diagnosis not present

## 2019-11-10 DIAGNOSIS — K5520 Angiodysplasia of colon without hemorrhage: Secondary | ICD-10-CM

## 2019-11-10 DIAGNOSIS — K5730 Diverticulosis of large intestine without perforation or abscess without bleeding: Secondary | ICD-10-CM

## 2019-11-10 DIAGNOSIS — K649 Unspecified hemorrhoids: Secondary | ICD-10-CM

## 2019-11-10 MED ORDER — SODIUM CHLORIDE 0.9 % IV SOLN: 500.0000 mL | Freq: Once | INTRAVENOUS | Status: DC

## 2019-11-10 NOTE — Progress Notes (Signed)
Called to room to assist during endoscopic procedure.  Patient ID and intended procedure confirmed with present staff. Received instructions for my participation in the procedure from the performing physician.  

## 2019-11-10 NOTE — Progress Notes (Signed)
Patient is here for EGD And colonoscopy for iron deficiency anemia today. On exam of his mouth he has multiple teeth missing in upper half, one tooth in the middle of upper half is loose and chipped. Concern about this being damaged or potentially falling out if bite block / EGD performed. In this light will hold off on EGD today, plan on colonoscopy only. Once dental work has been done (this is pending at the [place] he reports) he can come back for the EGD.

## 2019-11-10 NOTE — Op Note (Signed)
[place] Patient Name: Kevin Torres Procedure Date: 11/10/2019 10:57 AM MRN: [id number] Endoscopist: Remo Lipps P. Havery Moros , MD Age: 64 Referring MD:  Date of Birth: May 12, 1955 Gender: Male Account #: [id number] Procedure:                Colonoscopy Indications:              Iron deficiency anemia, first colonoscopy. EGD                            planned today as well however due to dentition                            (loose front tooth) EGD not performed today Medicines:                Monitored Anesthesia Care Procedure:                Pre-Anesthesia Assessment:                           - Prior to the procedure, a History and Physical                            was performed, and patient medications and                            allergies were reviewed. The patient's tolerance of                            previous anesthesia was also reviewed. The risks                            and benefits of the procedure and the sedation                            options and risks were discussed with the patient.                            All questions were answered, and informed consent                            was obtained. Prior Anticoagulants: The patient has                            taken no previous anticoagulant or antiplatelet                            agents. ASA Grade Assessment: III - A patient with                            severe systemic disease. After reviewing the risks                            and benefits, the patient was deemed in  satisfactory condition to undergo the procedure.                           After obtaining informed consent, the colonoscope                            was passed under direct vision. Throughout the                            procedure, the patient's blood pressure, pulse, and                            oxygen saturations were monitored continuously. The                            Colonoscope  was introduced through the anus and                            advanced to the the cecum, identified by                            appendiceal orifice and ileocecal valve. The                            colonoscopy was performed without difficulty. The                            patient tolerated the procedure well. The quality                            of the bowel preparation was good. The terminal                            ileum, ileocecal valve, appendiceal orifice, and                            rectum were photographed. Scope In: 11:17:37 AM Scope Out: 11:45:41 AM Scope Withdrawal Time: 0 hours 23 minutes 10 seconds  Total Procedure Duration: 0 hours 28 minutes 4 seconds  Findings:                 The perianal and digital rectal examinations were                            normal.                           The terminal ileum appeared normal.                           A 6 mm polyp was found in the transverse colon. The                            polyp was sessile. The polyp was removed with a  cold snare. Resection and retrieval were complete.                           Many medium-mouthed diverticula were found in the                            left colon. Left colon difficult to traverse due to                            this.                           A single small localized angiodysplastic lesion was                            found in the cecum.                           Internal hemorrhoids were found during retroflexion.                           The exam was otherwise without abnormality. Complications:            No immediate complications. Estimated blood loss:                            Minimal. Estimated Blood Loss:     Estimated blood loss was minimal. Impression:               - One 6 mm polyp in the transverse colon, removed                            with a cold snare. Resected and retrieved.                           - Diverticulosis in  the left colon.                           - A single colonic angiodysplastic lesion.                           - Internal hemorrhoids.                           - The examination was otherwise normal.                           Possible cecal AVM could be contributing to anemia                            but recommend EGD once dental work completed Recommendation:           - Patient has a contact number available for                            emergencies. The signs and symptoms of potential  delayed complications were discussed with the                            patient. Return to normal activities tomorrow.                            Written discharge instructions were provided to the                            patient.                           - Resume previous diet.                           - Continue present medications.                           - Await pathology results.                           - EGD once dental work completed [employer]. Tyana Butzer, MD 11/10/2019 11:55:52 AM This report has been signed electronically.

## 2019-11-10 NOTE — Patient Instructions (Signed)
Handouts given for polyps, diverticulosis and hemorrhoids.  Await pathology results.  You need to see the dentist prior to having the upper procedure done safely.  YOU HAD AN ENDOSCOPIC PROCEDURE TODAY AT [facility]:   Refer to the procedure report that was given to you for any specific questions about what was found during the examination.  If the procedure report does not answer your questions, please call your gastroenterologist to clarify.  If you requested that your care partner not be given the details of your procedure findings, then the procedure report has been included in a sealed envelope for you to review at your convenience later.  YOU SHOULD EXPECT: Some feelings of bloating in the abdomen. Passage of more gas than usual.  Walking can help get rid of the air that was put into your GI tract during the procedure and reduce the bloating. If you had a lower endoscopy (such as a colonoscopy or flexible sigmoidoscopy) you may notice spotting of blood in your stool or on the toilet paper. If you underwent a bowel prep for your procedure, you may not have a normal bowel movement for a few days.  Please Note:  You might notice some irritation and congestion in your nose or some drainage.  This is from the oxygen used during your procedure.  There is no need for concern and it should clear up in a day or so.  SYMPTOMS TO REPORT IMMEDIATELY:   Following lower endoscopy (colonoscopy or flexible sigmoidoscopy):  Excessive amounts of blood in the stool  Significant tenderness or worsening of abdominal pains  Swelling of the abdomen that is new, acute  Fever of 100F or higher  For urgent or emergent issues, a gastroenterologist can be reached at any hour by calling [phone]. Do not use MyChart messaging for urgent concerns.    DIET:  We do recommend a small meal at first, but then you may proceed to your regular diet.  Drink plenty of fluids but you should avoid  alcoholic beverages for 24 hours.  ACTIVITY:  You should plan to take it easy for the rest of today and you should NOT DRIVE or use heavy machinery until tomorrow (because of the sedation medicines used during the test).    FOLLOW UP: Our staff will call the number listed on your records 48-72 hours following your procedure to check on you and address any questions or concerns that you may have regarding the information given to you following your procedure. If we do not reach you, we will leave a message.  We will attempt to reach you two times.  During this call, we will ask if you have developed any symptoms of COVID 19. If you develop any symptoms (ie: fever, flu-like symptoms, shortness of breath, cough etc.) before then, please call [phone].  If you test positive for Covid 19 in the 2 weeks post procedure, please call and report this information to [place].    If any biopsies were taken you will be contacted by phone or by letter within the next 1-3 weeks.  Please call us at [phone] if you have not heard about the biopsies in 3 weeks.    SIGNATURES/CONFIDENTIALITY: You and/or your care partner have signed paperwork which will be entered into your electronic medical record.  These signatures attest to the fact that that the information above on your After Visit Summary has been reviewed and is understood.  Full responsibility of the confidentiality of this  discharge information lies with you and/or your care-partner. 

## 2019-11-10 NOTE — Progress Notes (Signed)
VS by CW  Pt's states no medical or surgical changes since previsit or office visit.  

## 2019-11-10 NOTE — Progress Notes (Signed)
To PACU, VSS. Report to [email or web address] 

## 2019-11-13 ENCOUNTER — Telehealth: Payer: Self-pay

## 2019-11-13 NOTE — Telephone Encounter (Signed)
-----   Message from Roetta Sessions, [place] sent at 10/09/2019 12:04 PM EDT ----- Regarding: FW: [place] rescheduled [facility] if [facility]/S on 11-06-19   ----- Message ----- From: Letta Pate, CMA Sent: 10/09/2019  11:28 AM EDT To: Roetta Sessions, CMA Subject: [place] rescheduled                                 Pt rescheduled his Ultrasound to 11/06/2019

## 2019-11-13 NOTE — Telephone Encounter (Signed)
Faxed request to Medical records for results of U/S to be sent to Dr. Havery Moros.

## 2019-11-14 ENCOUNTER — Telehealth: Payer: Self-pay

## 2019-11-14 NOTE — Telephone Encounter (Signed)
°  Follow up Call-  Call back number 11/10/2019  Post procedure Call Back phone  # [phone]  Permission to leave phone message Yes  Some recent data might be hidden     Patient questions:  Do you have a fever, pain , or abdominal swelling? No. Pain Score  0 *  Have you tolerated food without any problems? Yes.    Have you been able to return to your normal activities? Yes.    Do you have any questions about your discharge instructions: Diet   No. Medications  No. Follow up visit  No.  Do you have questions or concerns about your Care? No.  Actions: * If pain score is 4 or above: No action needed, pain <4.   1. Have you developed a fever since your procedure? No   2.   Have you had an respiratory symptoms (SOB or cough) since your procedure? No   3.   Have you tested positive for COVID 19 since your procedure ? No   4.   Have you had any family members/close contacts diagnosed with the COVID 19 since your procedure?  No    If yes to any of these questions please route to Joylene John, RN and Joella Prince, RN

## 2019-11-17 ENCOUNTER — Telehealth: Payer: Self-pay | Admitting: Gastroenterology

## 2019-11-17 NOTE — Telephone Encounter (Signed)
See result note.  Called and Left detailed message for Cheri, legal guardian, with results and recommendations after colonoscopy.  Will wait to hear from them when he has had his dental work done and we can schedule him for a direct EGD.

## 2019-11-17 NOTE — Telephone Encounter (Signed)
Cheri from Social Department services is requesting a call back from [facility]

## 2019-11-19 ENCOUNTER — Telehealth: Payer: Self-pay
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
## 2019-12-15 DEATH — deceased

## 2019-12-20 ENCOUNTER — Other Ambulatory Visit: Payer: Self-pay

## 2019-12-20 DIAGNOSIS — R7989 Other specified abnormal findings of blood chemistry: Secondary | ICD-10-CM

## 2019-12-20 NOTE — Progress Notes (Signed)
Called and LM for Kevin Torres that patient needs to go to lab for LFTs.  MyChart message sent

## 2019-12-21 ENCOUNTER — Telehealth: Payer: Self-pay

## 2019-12-21 NOTE — Telephone Encounter (Signed)
Oh no, so sorry to hear this. Did they say what happened? I can't see anything in his chart

## 2019-12-21 NOTE — Telephone Encounter (Signed)
Received return call from social worker, she wanted to make [place] aware that patient is deceased.
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
## 2020-01-05 NOTE — Progress Notes (Signed)
Letter mailed to pt to go to the lab

## 2021-08-03 IMAGING — US US ABDOMEN LIMITED
1 series · 14 of 25 positions shown · non-contrast
Comparison: None.

CLINICAL DATA: Elevated LFTs.

EXAM:
ULTRASOUND ABDOMEN LIMITED RIGHT UPPER QUADRANT

[Series 1: us abdomen limited · 14 of 67 slices shown]
[im 1/67]
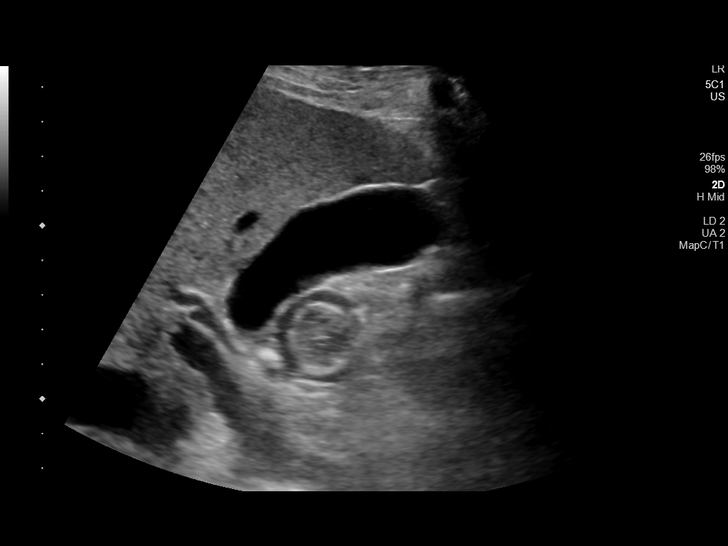
[im 6/67]
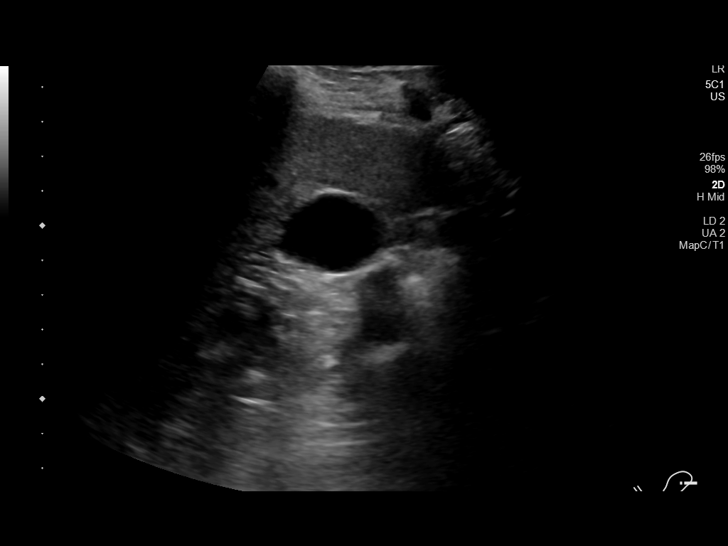
[im 12/67]
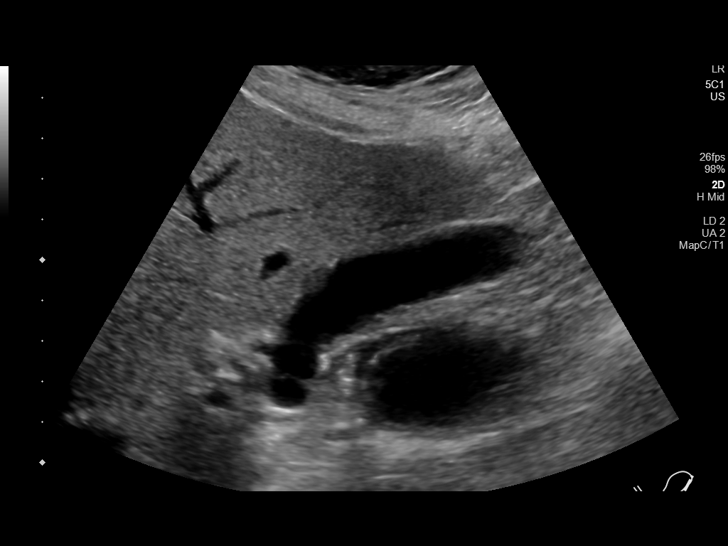
[im 17/67]
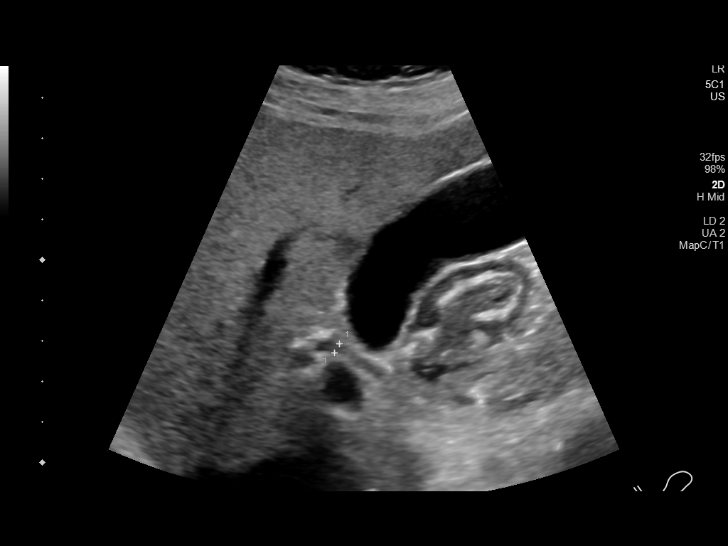
[im 23/67]
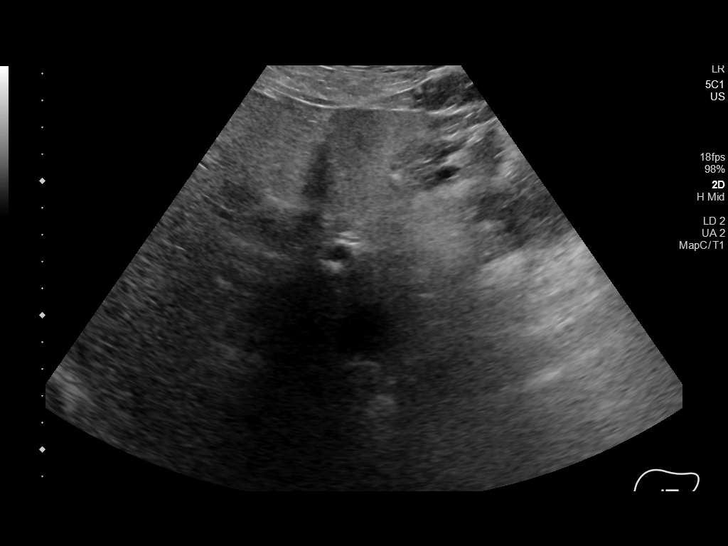
[im 25/67]
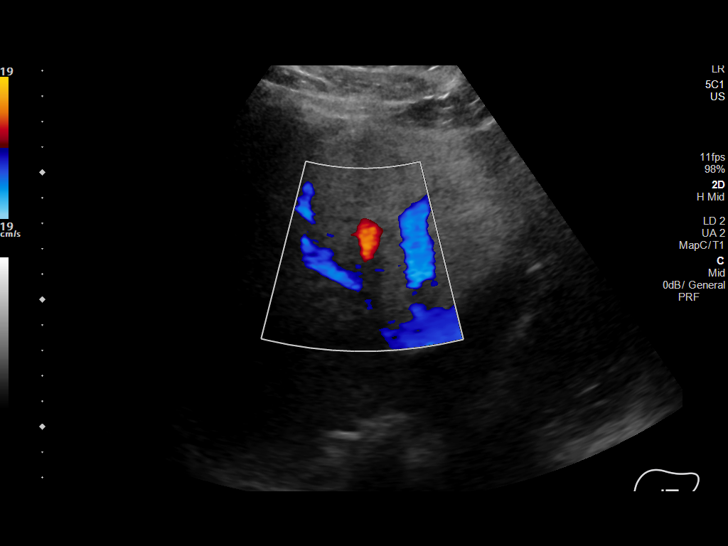
[im 31/67]
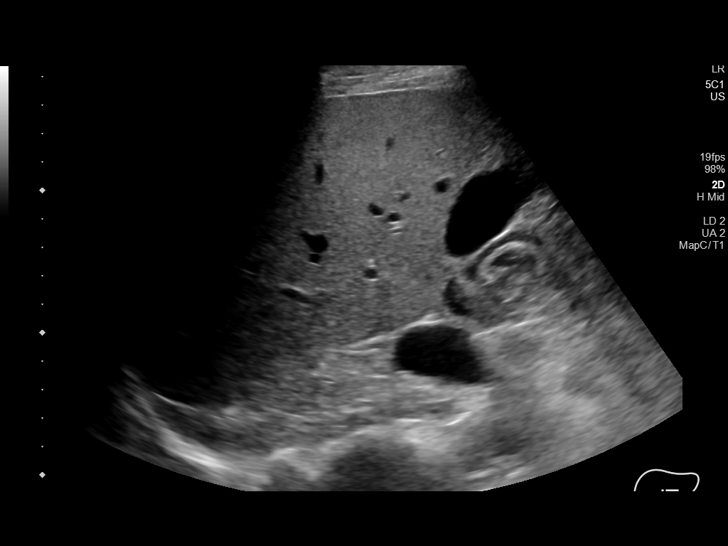
[im 36/67]
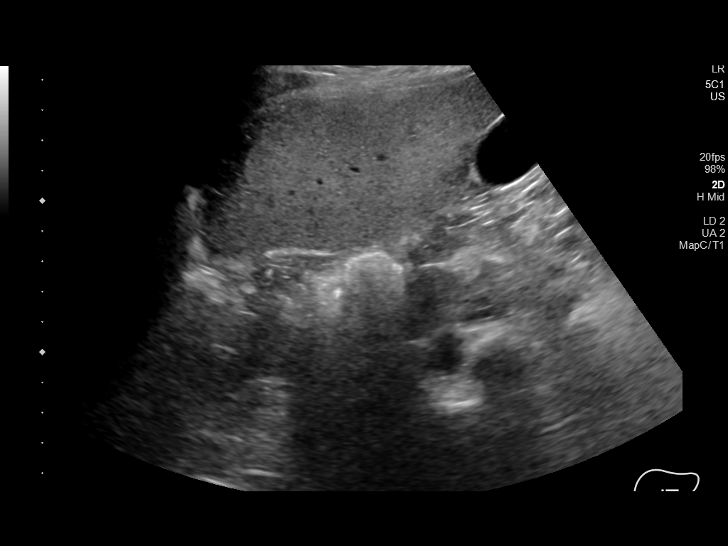
[im 42/67]
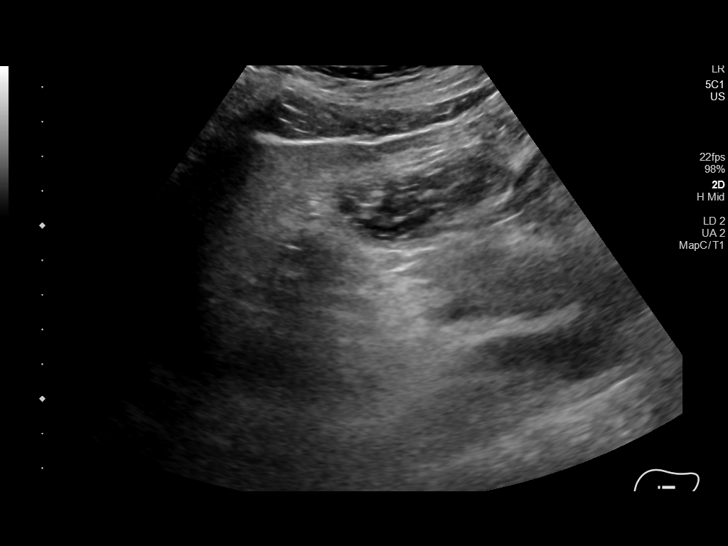
[im 45/67]
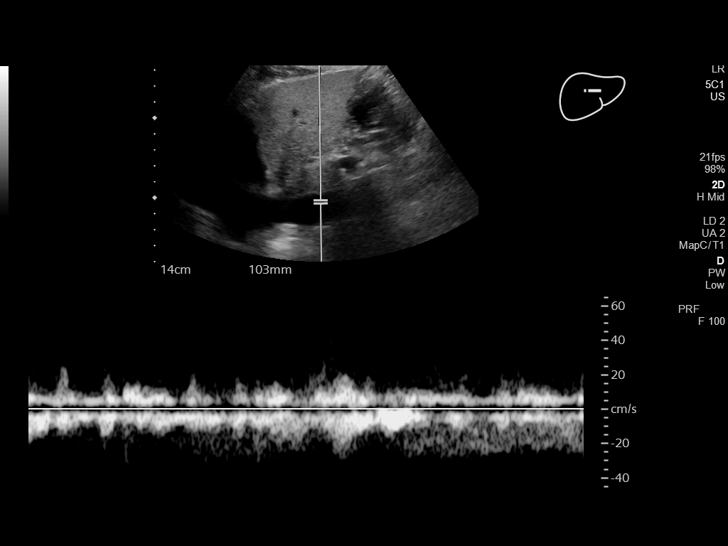
[im 50/67]
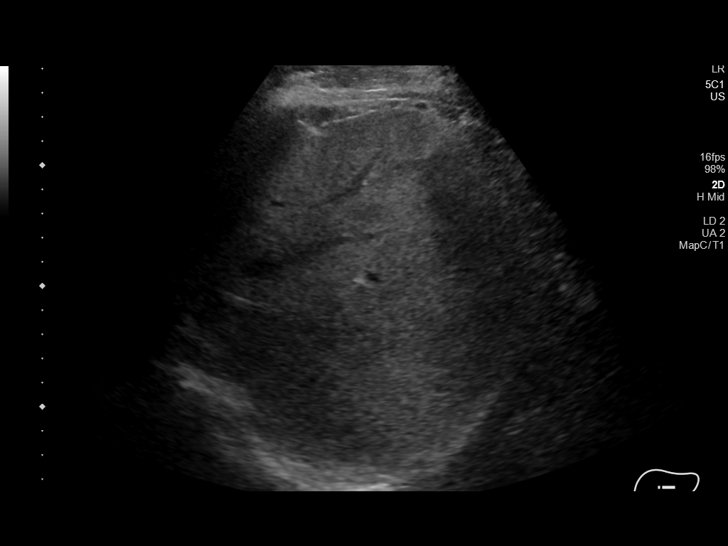
[im 56/67]
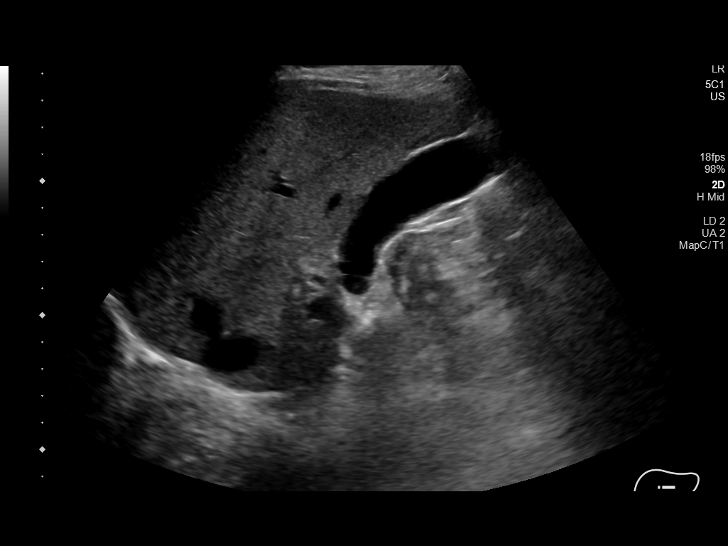
[im 61/67]
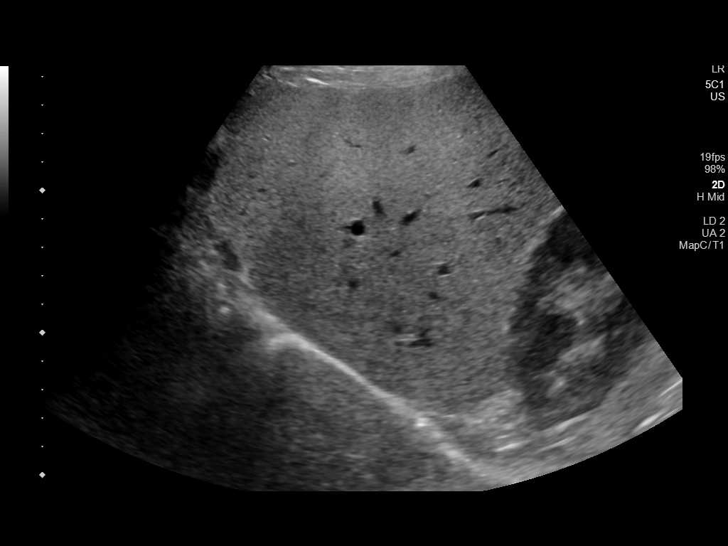
[im 67/67]
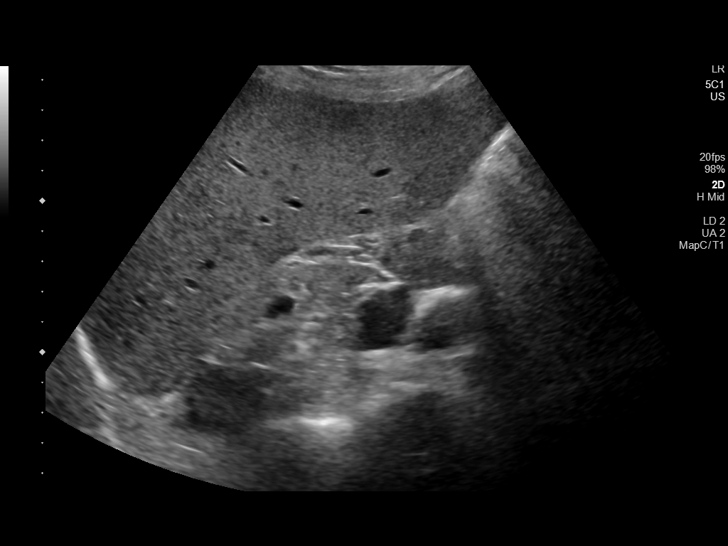

[14 of 25 positions shown; findings below may reference images not displayed]

FINDINGS: Gallbladder:

No gallstones or wall thickening visualized. No gallstones. There is
a 6 mm polyp. No sonographic Murphy sign noted by sonographer.

Common bile duct:

Diameter: 3 mm.

Liver:

No focal lesion identified. Heterogeneous and increased in
parenchymal echogenicity. No definite capsular nodularity. Portal
vein is patent on color Doppler imaging with normal direction of
blood flow towards the liver.

Other: No right upper quadrant ascites or free fluid.
IMPRESSION: 1. Heterogeneous and increased parenchymal echogenicity most
consistent with steatosis. No focal hepatic lesion.
2. Gallbladder polyp measuring 6 mm. A polyp of this size (<6mm)
needs no further evaluation or follow-up.
